# Patient Record
Sex: Female | Born: 1985 | Race: White | Hispanic: No | Smoking: Current every day smoker
Health system: Southern US, Community
[De-identification: ages and names within clinical notes are randomized; demographics above are authoritative.]

## PROBLEM LIST (undated history)

## (undated) HISTORY — PX: TUBAL LIGATION: SHX77

---

## 2008-09-25 ENCOUNTER — Emergency Department: Payer: Self-pay | Admitting: Emergency Medicine

## 2009-03-20 ENCOUNTER — Ambulatory Visit: Payer: Self-pay | Admitting: Family Medicine

## 2009-09-04 ENCOUNTER — Inpatient Hospital Stay: Payer: Self-pay

## 2009-10-03 IMAGING — US US OB US >=[ID] SNGL FETUS
1 series · 17 of 28 positions shown · non-contrast
Comparison: none

REASON FOR EXAM: dates
COMMENTS:

[Series 1: us ob us >=(id) sngl fetus · 17 of 51 slices shown]
[im 1/51]
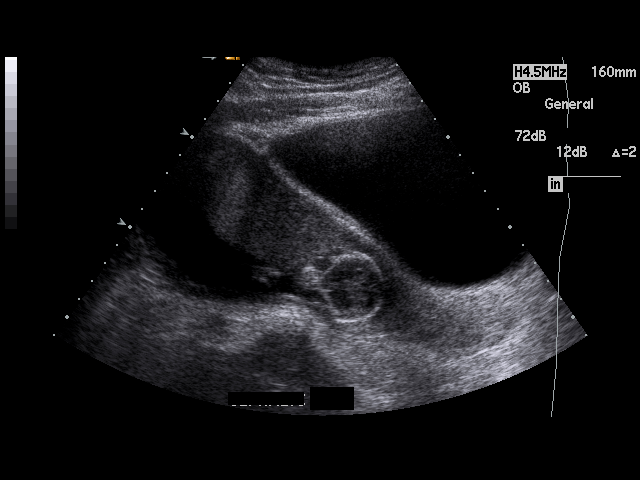
[im 4/51]
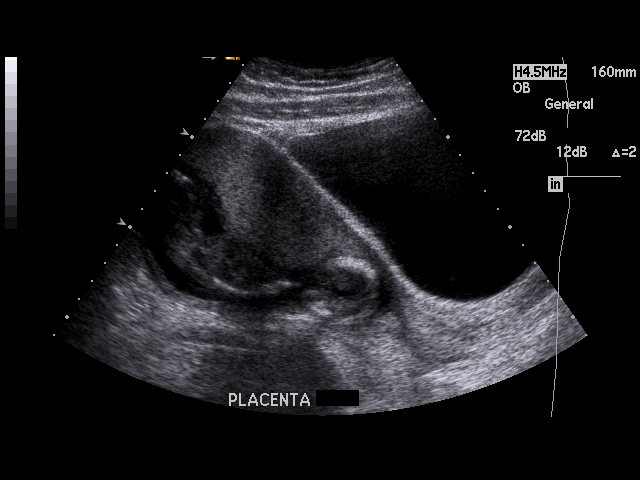
[im 8/51]
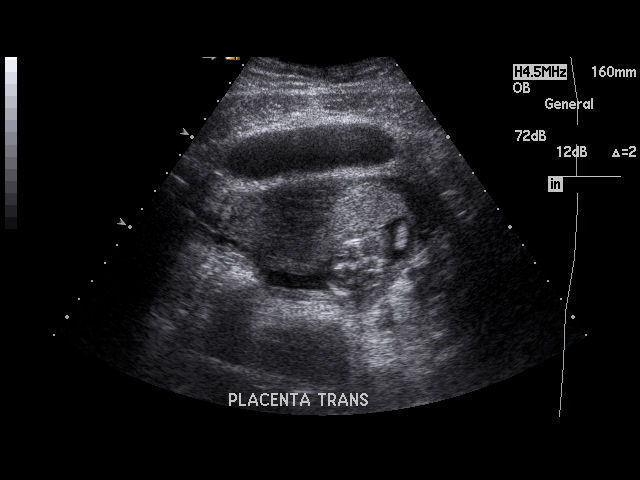
[im 10/51]
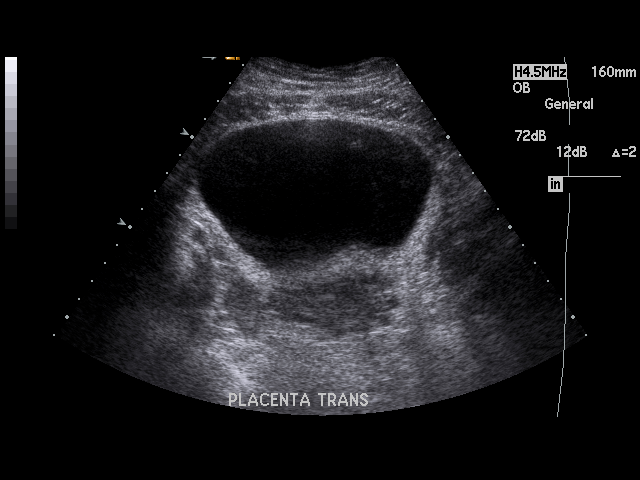
[im 13/51]
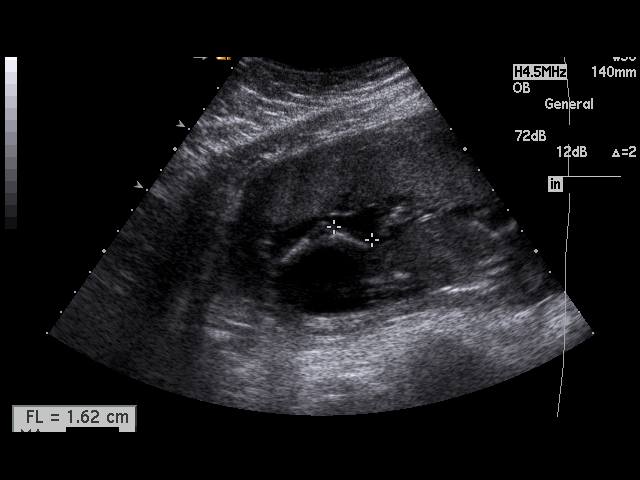
[im 17/51]
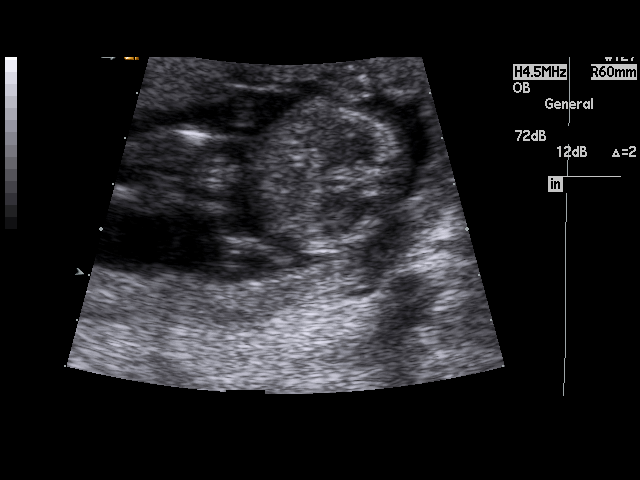
[im 19/51]
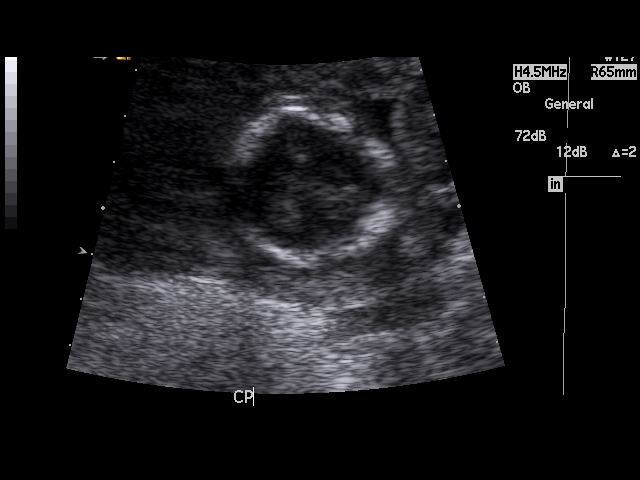
[im 23/51]
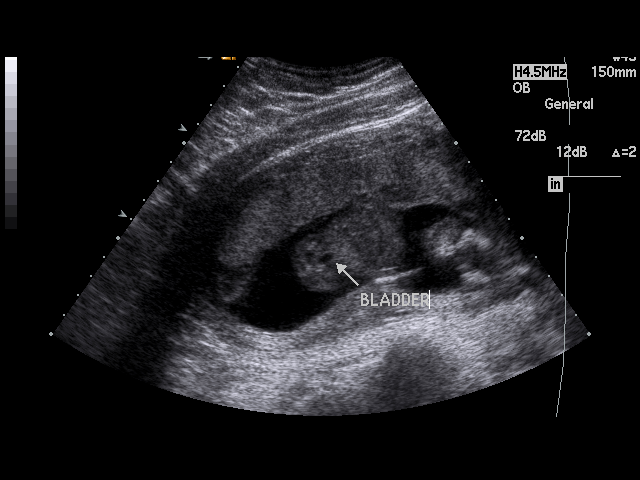
[im 26/51]
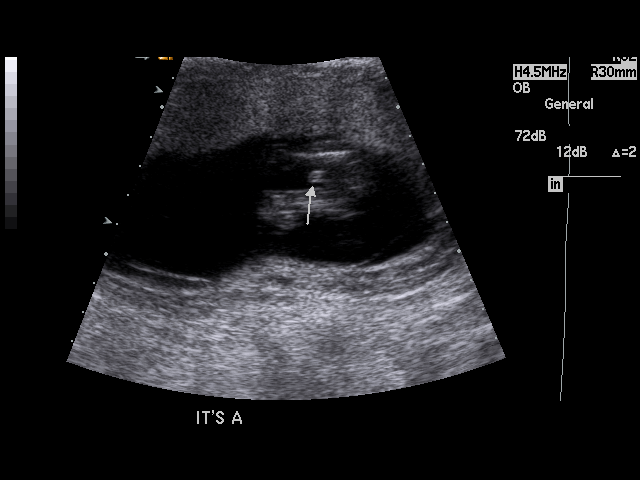
[im 28/51]
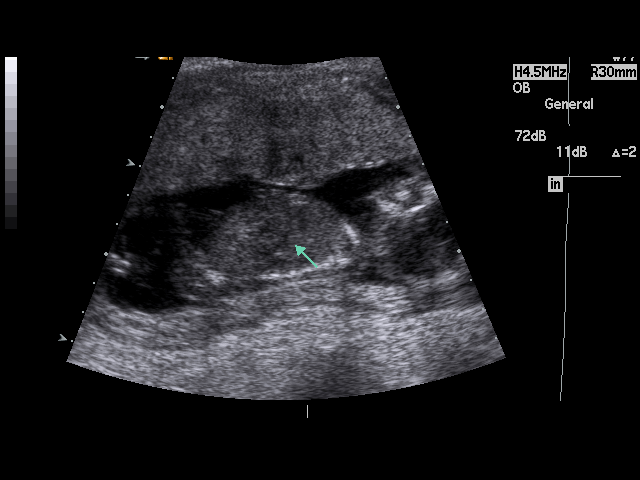
[im 32/51]
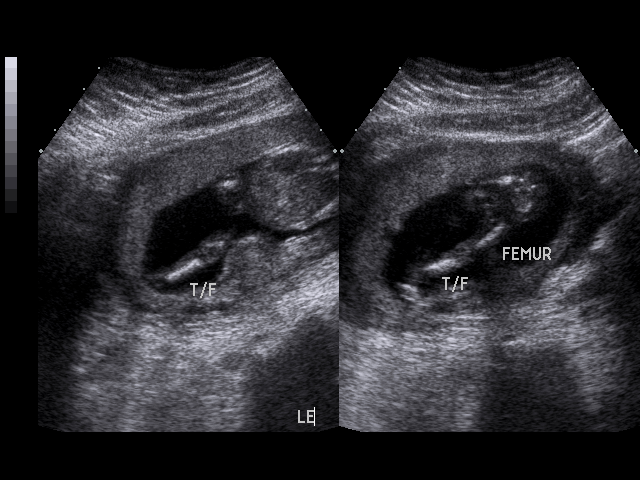
[im 34/51]
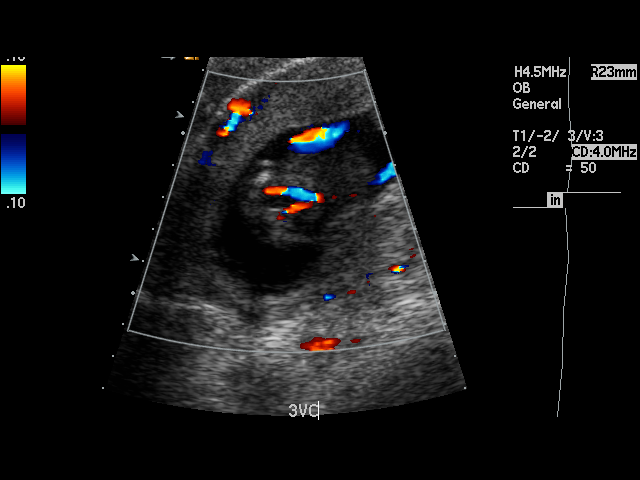
[im 38/51]
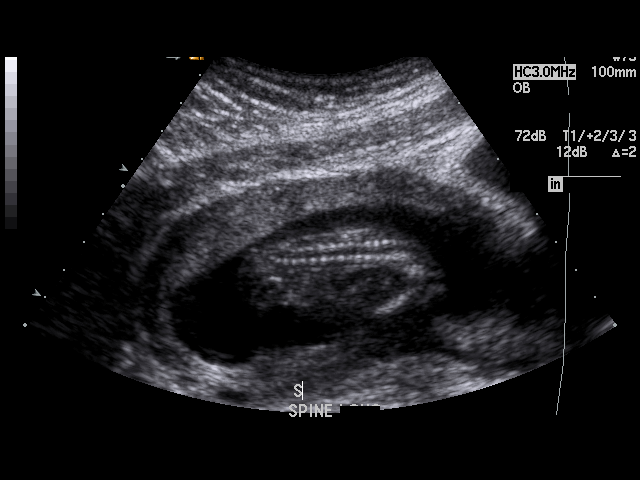
[im 41/51]
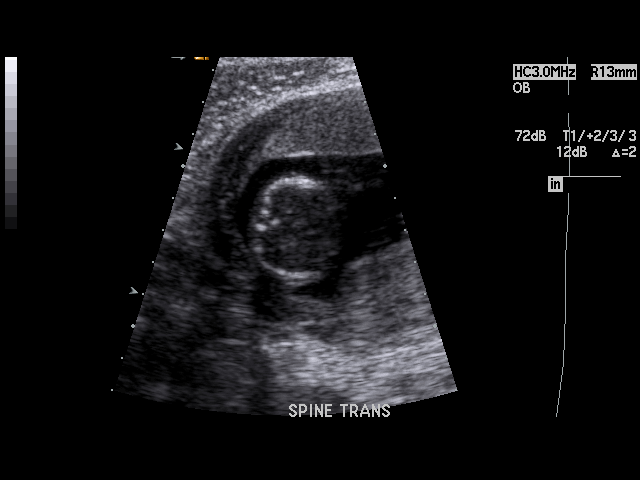
[im 43/51]
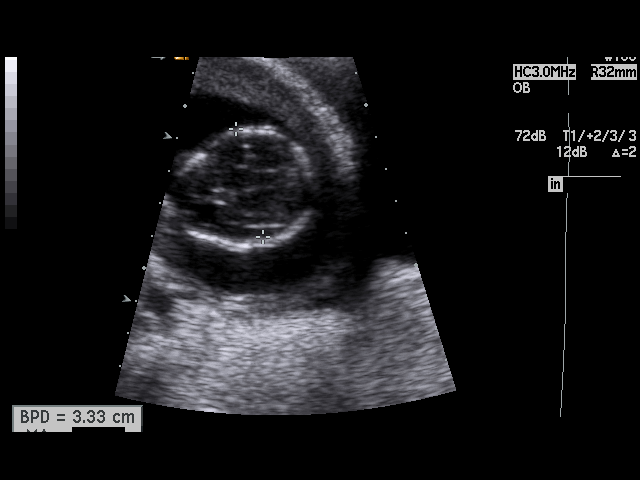
[im 47/51]
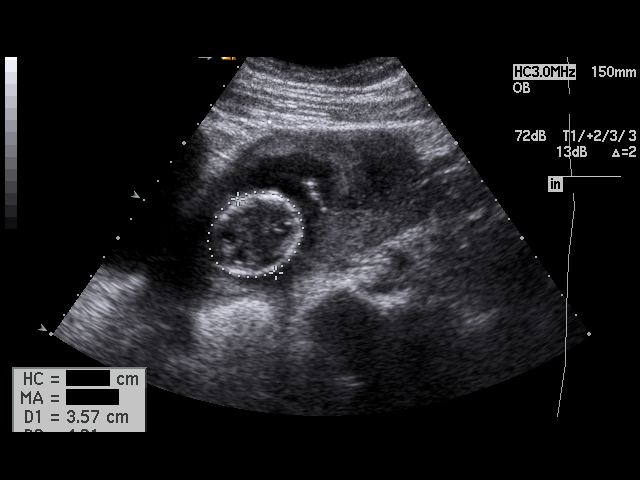
[im 51/51]
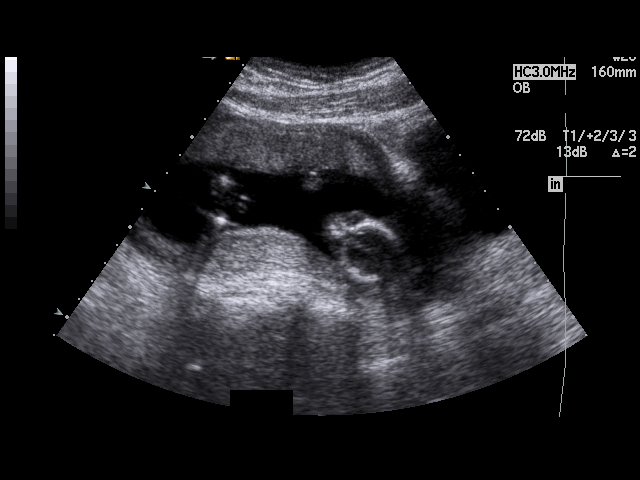

[17 of 28 positions shown; findings below may reference images not displayed]

PROCEDURE:     US  - US OB GREATER/OR EQUAL TO [id number]  - March 20, 2009  [DATE]

RESULT:     There is observed a single living intrauterine gestation.
Presentation is variable during the course of this exam. The placenta is
anterior. Cervix length measures 3.83 cm. The inferior tip of the cervix is
approximately 3.0 to 4.0 cm from the cervix. The placenta is anterior.
Amniotic fluid volume appears normal. The fetal heart rate was monitored at
163 beats per minute. The fetal heart, stomach, and urinary bladder are
visualized. Fetal parts are too small to evaluate accurately at this stage
and followup examination is recommended. Fetal measurements are as follows:

BPD     3.28 cm      Corresponding to 16 weeks 1 day.
HC           12.97 cm      Corresponding to 16 weeks 3 days.
AC          10.21 cm      Corresponding to 16 weeks 1 day.
FL           1.65 cm      Corresponding to 14 weeks 6 days.

EFW is 152 grams. Average ultrasound age is 16 weeks 0 days. Ultrasound EDD
is September 04, 2009.
IMPRESSION: Please see above.

## 2013-06-13 ENCOUNTER — Encounter
Admit: 2013-06-13 | Disposition: A | Discharge status: Home / Self Care | Payer: Self-pay | Admitting: Obstetrics & Gynecology

## 2013-07-21 ENCOUNTER — Encounter: Payer: Self-pay | Admitting: Obstetrics and Gynecology

## 2013-12-14 ENCOUNTER — Observation Stay: Payer: Self-pay | Admitting: Obstetrics and Gynecology

## 2013-12-14 LAB — DRUG SCREEN, URINE
Amphetamines, Ur Screen: NEGATIVE (ref ?–1000)
Cannabinoid 50 Ng, Ur ~~LOC~~: NEGATIVE (ref ?–50)
Cocaine Metabolite,Ur ~~LOC~~: NEGATIVE (ref ?–300)
Methadone, Ur Screen: NEGATIVE (ref ?–300)
Opiate, Ur Screen: NEGATIVE (ref ?–300)
Phencyclidine (PCP) Ur S: NEGATIVE (ref ?–25)
Tricyclic, Ur Screen: NEGATIVE (ref ?–1000)

## 2013-12-14 LAB — HEMOGLOBIN A1C: Hemoglobin A1C: 4.6 % (ref 4.2–6.3)

## 2013-12-17 ENCOUNTER — Observation Stay: Payer: Self-pay | Admitting: Obstetrics and Gynecology

## 2013-12-22 ENCOUNTER — Inpatient Hospital Stay: Payer: Self-pay

## 2013-12-22 LAB — CBC WITH DIFFERENTIAL/PLATELET
Basophil #: 0 10*3/uL (ref 0.0–0.1)
Basophil %: 0.2 %
Eosinophil #: 0.1 10*3/uL (ref 0.0–0.7)
Eosinophil %: 0.5 %
HCT: 36.1 % (ref 35.0–47.0)
HGB: 12.9 g/dL (ref 12.0–16.0)
Lymphocyte #: 1.6 10*3/uL (ref 1.0–3.6)
Lymphocyte %: 11.2 %
MCH: 33.3 pg (ref 26.0–34.0)
MCHC: 35.9 g/dL (ref 32.0–36.0)
MCV: 93 fL (ref 80–100)
MONOS PCT: 7.9 %
Monocyte #: 1.1 x10 3/mm — ABNORMAL HIGH (ref 0.2–0.9)
NEUTROS ABS: 11.4 10*3/uL — AB (ref 1.4–6.5)
NEUTROS PCT: 80.2 %
Platelet: 196 10*3/uL (ref 150–440)
RBC: 3.88 10*6/uL (ref 3.80–5.20)
RDW: 12.8 % (ref 11.5–14.5)
WBC: 14.2 10*3/uL — AB (ref 3.6–11.0)

## 2013-12-23 LAB — HEMATOCRIT: HCT: 33.3 % — AB (ref 35.0–47.0)

## 2015-04-24 NOTE — H&P (Signed)
L&D Evaluation:  History Expanded:  HPI 29 yo G2P1001 at 2967w1d gestational age by early ultrasound.  Her pregnancy has been complicated by insufficienct prenatal care with a lag in care between 18-32 weeks.  She has had a fetal ultrasound by [facility] at 18 weeks given a history of the father of the baby of pectus excavatum.  The ultrasound of heart was read as normal at that time, though she has not had a echocardiogram.  She was sent from clinic today due to fetal arrhythmia with normal baseline, but an occasional "skipped" beat.  She notes positive fetal movement, no vaginal bleeding, no leakage of fluid.  She is feeling mild tightening of her uterus occasionally.  She states that she was checked in clinic today and was told that her cervix was closed.   Blood Type (Maternal) A positive   Group B Strep Results Maternal (Result >5wks must be treated as unknown) unknown/result > 5 weeks ago   Maternal HIV Negative   Maternal Syphilis Ab Nonreactive   Maternal Varicella Immune   Rubella Results (Maternal) immune   [facility]   Patient's Medical History 1)anxiety, 2) abnormal pap smear, 3) depression   Patient's Surgical History none   Medications Pre Natal Vitamins   Allergies NKDA   Social History tobacco   Family History Non-Contributory   ROS:  ROS All systems were reviewed.  HEENT, CNS, GI, GU, Respiratory, CV, Renal and Musculoskeletal systems were found to be normal.   Exam:  Vital Signs AFVSS   General no apparent distress   Mental Status clear   Chest clear   Heart normal sinus rhythm   Abdomen gravid, non-tender   Estimated Fetal Weight Average for gestational age   Fetal Position cephalic   Back no CVAT   Edema no edema   Mebranes Intact   FHT normal rate with no decels   FHT Description 125/mod var/+accels/no decels (audible skipped beat about 2-3 times per minute)   Ucx irregular   Other Fetal ultrasound perfromed in radiology; normal  AFI, normal growth. No evidence of hydrops   Impression:  Impression ectopic fetal heart beat   Plan:  Comments 1) urine drug screen negative 2) has not had GDM screen this pregnancy, but labs appear normal. She is essentially fasting when these were taken. 3) I had an approximately 30 minute discussion with this patient, face to face, regarding the fetal heart rate.  The heart rate baseline is in a normal range (~125), there is no evidence of hydrops or fetal heart failure on ultrasound.  Discussed that the MOST LIKELY cause of this ectopic beat is a Premature Atrial Contraction (PAC).  This is a benign condition, one which can be followed.  Discussed that this normally resolves after delivery.  Given her gestational age, I did offer to keep her and induce her given the fact that I could not guarantee her that the diagnosis was PACs.  Discussed risks/benefits of continuing pregnancy versus induction tonight/tomorrow.  Discussed that I currently have no access to diagnostic equipment or expertise to say for sure the type of abnormality she is having with the fetal heartbeat.  She would like to go home tonight and return for antenatal testing in a few days. I offered her an NST here in 2 days (per RN, schedule did not allow for this.  So, had to be scheduled for 3 days from now).  Gave very strict precautions regarding fetal movement.  Gave the usual precuations regarding  labor, preeclampsia, etc.  She voiced understanding of the above . She made the decision based on discussion with her husband and with me.   Follow Up Appointment NST in 3 days.   Labs:  Lab Results:  Routine Chem:  31-Dec-14 15:48   Glucose, Serum 71 (Result(s) reported on 14 Dec 2013 at 04:13PM.)  Hemoglobin A1c [facility] 4.6 (The American Diabetes Association recommends that a primary goal of therapy should be <7% and that physicians should reevaluate the treatment regimen in patients with HbA1c values consistently >8%.)  Urine  Drugs:  31-Dec-14 17:03   Tricyclic Antidepressant, Ur Qual (comp) NEGATIVE (Result(s) reported on 14 Dec 2013 at 05:21PM.)  Amphetamines, Urine Qual. NEGATIVE  MDMA, Urine Qual. NEGATIVE  Cocaine Metabolite, Urine Qual. NEGATIVE  Opiate, Urine qual NEGATIVE  Phencyclidine, Urine Qual. NEGATIVE  Cannabinoid, Urine Qual. NEGATIVE  Barbiturates, Urine Qual. NEGATIVE  Benzodiazepine, Urine Qual. NEGATIVE (----------------- The URINE DRUG SCREEN provides only a preliminary, unconfirmed analytical test result and should not be used for non-medical  purposes.  Clinical consideration and professional judgment should be  applied to any positive drug screen result due to possible interfering substances.  A more specific alternate chemical method must be used in order to obtain a confirmed analytical result.  Gas chromatography/mass spectrometry (GC/MS) is the preferred confirmatory method.)  Methadone, Urine Qual. NEGATIVE   Electronic Signatures: Conard NovakJackson, Trey Bebee D (MD)  (Signed 31-Dec-14 19:13)  Authored: L&D Evaluation, Labs   Last Updated: 31-Dec-14 19:13 by Conard NovakJackson, Nivek Powley D (MD)

## 2015-04-24 NOTE — H&P (Signed)
L&D Evaluation:  History:  HPI 29 yo G2P1001 at 5037w2d gestational age by early ultrasound.  Her pregnancy has been complicated by insufficienct prenatal care with a lag in care between 18-32 weeks.  She has had a fetal ultrasound by [facility] at 18 weeks given a history of the father of the baby of pectus excavatum.  The ultrasound of heart was read as normal at that time, though she has not had a echocardiogram.  She has been followed over the last week for a new onset fetal arythmia most consistent with PAC's.  [facility] but did not go as [place].  She presents today with LOF.  No VB, +FM, some irregular contractions   Presents with leaking fluid   Patient's Medical History 1)anxiety, 2) abnormal pap smear, 3) depression   Patient's Surgical History none   Medications Pre Natal Vitamins   Allergies NKDA   Social History tobacco   Family History Non-Contributory   ROS:  ROS All systems were reviewed.  HEENT, CNS, GI, GU, Respiratory, CV, Renal and Musculoskeletal systems were found to be normal.   Exam:  Vital Signs stable  AFVSS   General no apparent distress   Mental Status clear   Chest clear   Heart normal sinus rhythm   Abdomen gravid, non-tender   Estimated Fetal Weight Average for gestational age   Fetal Position cephalic   Back no CVAT   Edema no edema   Pelvic 2/80/-1   Mebranes Intact, equivocal nitrazine, no pooling, no ferning   FHT normal rate with no decels   FHT Description 145/mod var/no accels/no decels still with intermitten PAC's)   Ucx regular, 5-576min   Impression:  Impression R/O Rupture of Membranes   Plan:  Comments 1) PAC's - prior anatomy US with detailed view of fetal chest because of paternal history of pectus excavatum.  Has not had fetal echocardiogram as she did not keep DP appointment.     - discussed that if truely PAC'sm and given low concern for  congenital cardiac malformation based on prior US, no evidence of hydrops last week should most likely resolve after delivery     - urine drug screen on prior admission negative  2) Fetus - category I tracing  3) R/O ROM - no evidence of ROM but favorable cervix.  In light of this and PAC's once agian offered pitocin induction and patient accepts  4) Disposition - anticipate vaginal delivery   Electronic Signatures: Lorrene ReidStaebler, Labrandon Knoch M (MD)  (Signed 08-Jan-15 15:34)  Authored: L&D Evaluation   Last Updated: 08-Jan-15 15:34 by Lorrene ReidStaebler, Zuhair Lariccia M (MD)

## 2020-05-30 ENCOUNTER — Emergency Department (HOSPITAL_COMMUNITY): Payer: Medicaid Other

## 2020-05-30 ENCOUNTER — Encounter (HOSPITAL_COMMUNITY): Payer: Self-pay | Admitting: Emergency Medicine

## 2020-05-30 ENCOUNTER — Other Ambulatory Visit: Payer: Self-pay

## 2020-05-30 ENCOUNTER — Emergency Department (HOSPITAL_COMMUNITY)
Admission: EM | Admit: 2020-05-30 | Discharge: 2020-05-30 | Disposition: A | Discharge status: Home / Self Care | Payer: Medicaid Other | Attending: Emergency Medicine | Admitting: Emergency Medicine

## 2020-05-30 DIAGNOSIS — R0602 Shortness of breath: Secondary | ICD-10-CM | POA: Diagnosis not present

## 2020-05-30 DIAGNOSIS — Z5321 Procedure and treatment not carried out due to patient leaving prior to being seen by health care provider: Secondary | ICD-10-CM | POA: Diagnosis not present

## 2020-05-30 LAB — CBC WITH DIFFERENTIAL/PLATELET
Abs Immature Granulocytes: 0.02 10*3/uL (ref 0.00–0.07)
Basophils Absolute: 0 10*3/uL (ref 0.0–0.1)
Basophils Relative: 0 %
Eosinophils Absolute: 0 10*3/uL (ref 0.0–0.5)
Eosinophils Relative: 0 %
HCT: 39.2 % (ref 36.0–46.0)
Hemoglobin: 13.4 g/dL (ref 12.0–15.0)
Immature Granulocytes: 0 %
Lymphocytes Relative: 29 %
Lymphs Abs: 2.8 10*3/uL (ref 0.7–4.0)
MCH: 32.2 pg (ref 26.0–34.0)
MCHC: 34.2 g/dL (ref 30.0–36.0)
MCV: 94.2 fL (ref 80.0–100.0)
Monocytes Absolute: 0.8 10*3/uL (ref 0.1–1.0)
Monocytes Relative: 8 %
Neutro Abs: 6.1 10*3/uL (ref 1.7–7.7)
Neutrophils Relative %: 63 %
Platelets: 211 10*3/uL (ref 150–400)
RBC: 4.16 MIL/uL (ref 3.87–5.11)
RDW: 11.3 % — ABNORMAL LOW (ref 11.5–15.5)
WBC: 9.7 10*3/uL (ref 4.0–10.5)
nRBC: 0 % (ref 0.0–0.2)

## 2020-05-30 LAB — I-STAT BETA HCG BLOOD, ED (MC, WL, AP ONLY): I-stat hCG, quantitative: <5 m[IU]/mL (ref <5)

## 2020-05-30 LAB — COMPREHENSIVE METABOLIC PANEL
ALT: 25 U/L (ref 0–44)
AST: 25 U/L (ref 15–41)
Albumin: 3.7 g/dL (ref 3.5–5.0)
Alkaline Phosphatase: 59 U/L (ref 38–126)
Anion gap: 9 (ref 5–15)
BUN: 13 mg/dL (ref 6–20)
CO2: 26 mmol/L (ref 22–32)
Calcium: 9.2 mg/dL (ref 8.9–10.3)
Chloride: 107 mmol/L (ref 98–111)
Creatinine, Ser: 1.08 mg/dL — ABNORMAL HIGH (ref 0.44–1.00)
GFR calc Af Amer: >60 mL/min (ref >60)
GFR calc non Af Amer: >60 mL/min (ref >60)
Glucose, Bld: 122 mg/dL — ABNORMAL HIGH (ref 70–99)
Potassium: 3.5 mmol/L (ref 3.5–5.1)
Sodium: 142 mmol/L (ref 135–145)
Total Bilirubin: 0.2 mg/dL — ABNORMAL LOW (ref 0.3–1.2)
Total Protein: 6.3 g/dL — ABNORMAL LOW (ref 6.5–8.1)

## 2020-05-30 NOTE — ED Notes (Signed)
Pt states they are leaving  

## 2020-05-30 NOTE — ED Triage Notes (Signed)
Patient reports SOB since Saturday this week , denies cough or fever /no chest pain .

## 2020-08-29 ENCOUNTER — Ambulatory Visit: Payer: Medicaid Other | Admitting: Advanced Practice Midwife

## 2020-08-29 ENCOUNTER — Other Ambulatory Visit (HOSPITAL_COMMUNITY)
Admission: RE | Admit: 2020-08-29 | Discharge: 2020-08-29 | Disposition: A | Discharge status: Home / Self Care | Payer: Medicaid Other | Source: Ambulatory Visit | Attending: Obstetrics and Gynecology | Admitting: Obstetrics and Gynecology

## 2020-08-29 ENCOUNTER — Other Ambulatory Visit: Payer: Self-pay

## 2020-08-29 ENCOUNTER — Encounter: Payer: Self-pay | Admitting: Advanced Practice Midwife

## 2020-08-29 VITALS — BP 129/88 | HR 88 | Ht 63.0 in | Wt 180.6 lb

## 2020-08-29 DIAGNOSIS — K629 Disease of anus and rectum, unspecified: Secondary | ICD-10-CM | POA: Diagnosis not present

## 2020-08-29 DIAGNOSIS — L821 Other seborrheic keratosis: Secondary | ICD-10-CM

## 2020-08-29 NOTE — Progress Notes (Signed)
   VULVAR BIOPSY NOTE  Meghan Blackwell [id number]  08/29/2020  Indication for biopsy: seborrheic keratosis at right perineum  The indications for vulvar biopsy were reviewed. Risks of the biopsy including pain, bleeding, infection, inadequate specimen, and need for additional procedures were discussed. The patient stated understanding and agreed to undergo procedure today. Consent was signed, and an adequate time out performed.   The patient's vulva was prepped with Betadine. 1% lidocaine was injected into right perineum. A 3-mm punch biopsy was done at size, biopsy tissue was picked up with sterile forceps and sterile scissors were used to excise the lesion.  Small bleeding was noted and hemostasis was achieved using silver nitrate sticks.    The patient tolerated the procedure well.   Specimens:  1. Vulvar biopsy  Post-procedure instructions were given to the patient. The patient is to call with heavy bleeding, fever greater than 100.4, foul smelling vaginal discharge or other concerns. The patient will be return to clinic in two weeks for discussion of results.   Baldemar Lenis, M.D. Attending Obstetrician & Gynecologist, [facility] for [name], [name]

## 2020-08-29 NOTE — Progress Notes (Signed)
  GYNECOLOGY PROGRESS NOTE  History:  34 y.o. G3P2 presents to [facility] office today for problem gyn visit. She reports a lesion near her anal area that has been present x 5 years.  She was diagnosed with hpv and genital warts and prescribed Aldara topical which helped with some of the area but the dark area remains.  There is no itching, burning or discomfort.    She denies h/a, dizziness, shortness of breath, n/v, or fever/chills.    The following portions of the patient's history were reviewed and updated as appropriate: allergies, current medications, past family history, past medical history, past social history, past surgical history and problem list. Last pap smear in 2020 was normal per pt.  Review of Systems:  Pertinent items are noted in HPI.   Objective:  Physical Exam Blood pressure 129/88, pulse 88, height 5\' 3"  (1.6 m), weight 180 lb 9.6 oz (81.9 kg), last menstrual period 08/15/2020. VS reviewed, nursing note reviewed,  Constitutional: well developed, well nourished, no distress HEENT: normocephalic CV: normal rate Pulm/chest wall: normal effort Breast Exam: deferred Abdomen: soft Neuro: alert and oriented x 3 Skin: warm, dry Psych: affect normal Pelvic exam: On visual inspection, to right side of anus, one large crescent shaped 2 x 0.5 cm and one small ,less than 0.5 x 0.5 cm brown papule, each with warty or stuck on appearance.  Assessment & Plan:  1. Seborrheic keratosis --Pt treating lesion as possible hpv, genital warts. She had other genital warts that resolved with Aldara topical treatment but this darker area would not resolve.   --Dr 10/15/2020 called to room for evaluation and lesion most c/w seborrheic keratosis. Punch biopsy to confirm. See separate procedure note. --No genital warts visualized, may be resolved post tx with Aldara or may not be hpv at all. - Surgical pathology( [place]/ [facility])  2. Anal lesion  Earlene Plater, CNM 5:00 PM

## 2020-08-31 LAB — SURGICAL PATHOLOGY

## 2020-11-21 ENCOUNTER — Telehealth: Payer: Self-pay | Admitting: General Practice

## 2020-11-21 NOTE — Telephone Encounter (Signed)
Copied from CRM [id number]. Topic: General - Other >> Nov 21, 2020  4:05 PM Gwenlyn Fudge wrote: Reason for CRM: Pt called and is requesting to schedule a therapy appt with [facility]. Please advise.   Did not see where patient had been seen in our clinic before and did not know how to advise. Please follow up.

## 2020-12-05 ENCOUNTER — Telehealth: Payer: Self-pay | Admitting: Licensed Clinical Social Worker

## 2020-12-05 ENCOUNTER — Institutional Professional Consult (permissible substitution): Payer: Medicaid Other | Admitting: Licensed Clinical Social Worker

## 2020-12-05 NOTE — Telephone Encounter (Signed)
Call placed to patient regarding scheduled [facility] appointment. LCSW left message requesting a return call.  

## 2020-12-13 IMAGING — CR DG CHEST 2V
2 series · 2 of 2 positions shown · non-contrast
Comparison: None.

CLINICAL DATA: Shortness of breath

EXAM:
CHEST - 2 VIEW

[chest pa]
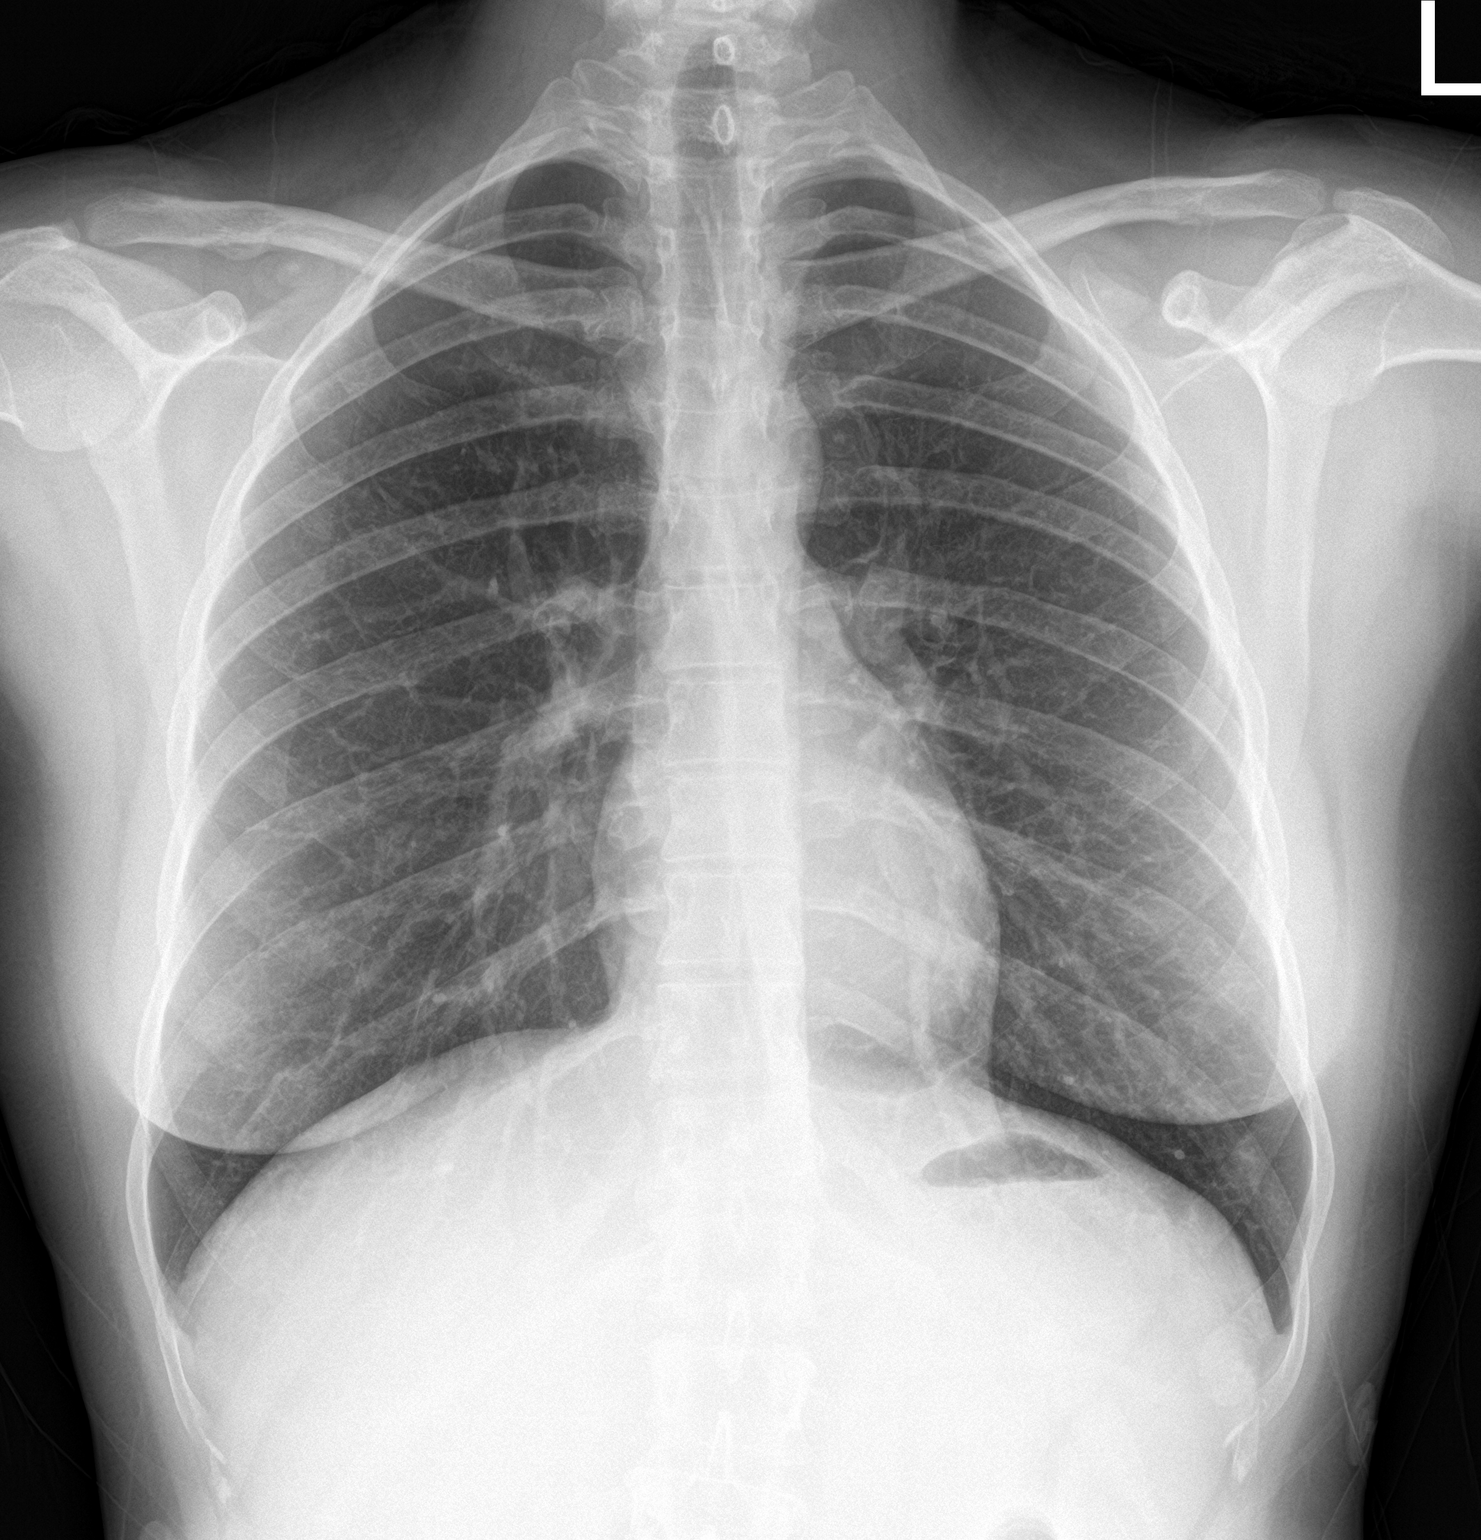

[chest lat]
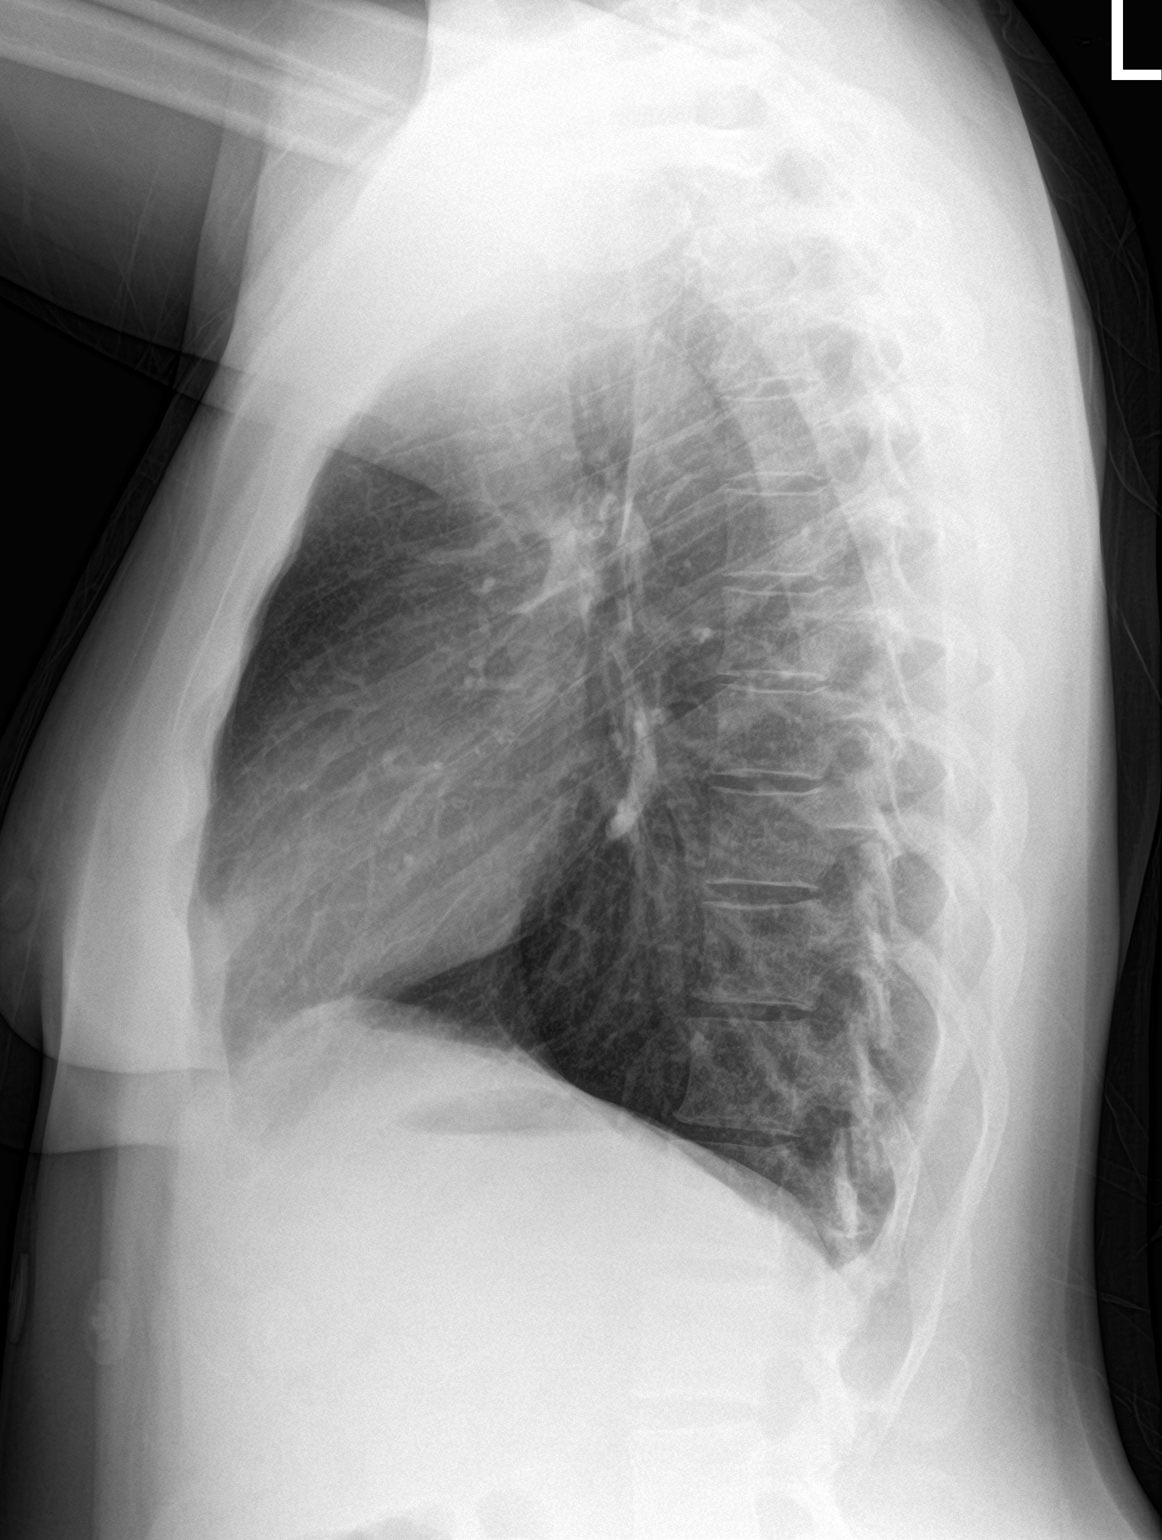

[2 of 2 positions shown; findings below may reference images not displayed]

FINDINGS: Normal heart size and mediastinal contours. No acute infiltrate or
edema. No effusion or pneumothorax. No acute osseous findings.
IMPRESSION: Negative chest.

## 2024-03-10 ENCOUNTER — Encounter: Payer: Medicaid Other | Admitting: Internal Medicine

## 2024-03-10 NOTE — Progress Notes (Deleted)
   Office Visit Note  Patient: Meghan Blackwell             Date of Birth: [birth year]           MRN: [id number]             PCP: Patient, No Pcp Per Referring: Shelle Iron, MD Visit Date: 03/10/2024 Occupation: @GUAROCC @  Subjective:  No chief complaint on file.   History of Present Illness: Meghan Blackwell is a 38 y.o. female ***     Activities of Daily Living:  Patient reports morning stiffness for *** {minute/hour:19697}.   Patient {ACTIONS;DENIES/REPORTS:21021675::"Denies"} nocturnal pain.  Difficulty dressing/grooming: {ACTIONS;DENIES/REPORTS:21021675::"Denies"} Difficulty climbing stairs: {ACTIONS;DENIES/REPORTS:21021675::"Denies"} Difficulty getting out of chair: {ACTIONS;DENIES/REPORTS:21021675::"Denies"} Difficulty using hands for taps, buttons, cutlery, and/or writing: {ACTIONS;DENIES/REPORTS:21021675::"Denies"}  No Rheumatology ROS completed.   PMFS History:  There are no active problems to display for this patient.   No past medical history on file.  Family History  Problem Relation Age of Onset   Colon cancer Mother    Breast cancer Mother    Past Surgical History:  Procedure Laterality Date   TUBAL LIGATION     Social History   Social History Narrative   Not on file   Immunization History  Administered Date(s) Administered   PFIZER(Purple Top)SARS-COV-2 Vaccination 07/25/2020     Objective: Vital Signs: There were no vitals taken for this visit.   Physical Exam   Musculoskeletal Exam: ***  CDAI Exam: CDAI Score: -- Patient Global: --; Provider Global: -- Swollen: --; Tender: -- Joint Exam 03/10/2024   No joint exam has been documented for this visit   There is currently no information documented on the homunculus. Go to the Rheumatology activity and complete the homunculus joint exam.  Investigation: No additional findings.  Imaging: No results found.  Recent Labs: Lab Results  Component Value Date   WBC 9.7 05/30/2020   HGB  13.4 05/30/2020   PLT 211 05/30/2020   NA 142 05/30/2020   K 3.5 05/30/2020   CL 107 05/30/2020   CO2 26 05/30/2020   GLUCOSE 122 (H) 05/30/2020   BUN 13 05/30/2020   CREATININE 1.08 (H) 05/30/2020   BILITOT 0.2 (L) 05/30/2020   ALKPHOS 59 05/30/2020   AST 25 05/30/2020   ALT 25 05/30/2020   PROT 6.3 (L) 05/30/2020   ALBUMIN 3.7 05/30/2020   CALCIUM 9.2 05/30/2020   GFRAA >60 05/30/2020    Speciality Comments: No specialty comments available.  Procedures:  No procedures performed Allergies: Patient has no known allergies.   Assessment / Plan:     Visit Diagnoses: No diagnosis found.  Orders: No orders of the defined types were placed in this encounter.  No orders of the defined types were placed in this encounter.   Face-to-face time spent with patient was *** minutes. Greater than 50% of time was spent in counseling and coordination of care.  Follow-Up Instructions: No follow-ups on file.   Fuller Plan, MD  Note - This record has been created using [employer].  Chart creation errors have been sought, but may not always  have been located. Such creation errors do not reflect on  the standard of medical care.
